# Patient Record
Sex: Male | Born: 1985 | Race: Black or African American | Hispanic: No | Marital: Single | Smoking: Current every day smoker
Health system: Southern US, Community
[De-identification: ages and names within clinical notes are randomized; demographics above are authoritative.]

---
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
## 2007-07-20 ENCOUNTER — Emergency Department (HOSPITAL_COMMUNITY): Admission: EM | Admit: 2007-07-20 | Discharge: 2007-07-20 | Payer: Self-pay | Admitting: Emergency Medicine

## 2014-08-23 ENCOUNTER — Emergency Department (INDEPENDENT_AMBULATORY_CARE_PROVIDER_SITE_OTHER)
Admission: EM | Admit: 2014-08-23 | Discharge: 2014-08-23 | Disposition: A | Discharge status: Home / Self Care | Payer: Self-pay | Source: Home / Self Care | Attending: Emergency Medicine | Admitting: Emergency Medicine

## 2014-08-23 ENCOUNTER — Encounter (HOSPITAL_COMMUNITY): Payer: Self-pay | Admitting: Emergency Medicine

## 2014-08-23 DIAGNOSIS — S46019A Strain of muscle(s) and tendon(s) of the rotator cuff of unspecified shoulder, initial encounter: Secondary | ICD-10-CM

## 2014-08-23 DIAGNOSIS — S060X0A Concussion without loss of consciousness, initial encounter: Secondary | ICD-10-CM

## 2014-08-23 DIAGNOSIS — S161XXA Strain of muscle, fascia and tendon at neck level, initial encounter: Secondary | ICD-10-CM

## 2014-08-23 DIAGNOSIS — S39012A Strain of muscle, fascia and tendon of lower back, initial encounter: Secondary | ICD-10-CM

## 2014-08-23 DIAGNOSIS — Y92410 Unspecified street and highway as the place of occurrence of the external cause: Secondary | ICD-10-CM

## 2014-08-23 MED ORDER — DICLOFENAC SODIUM 75 MG PO TBEC: 75.0000 mg | DELAYED_RELEASE_TABLET | Freq: Two times a day (BID) | ORAL | Status: DC

## 2014-08-23 MED ORDER — CYCLOBENZAPRINE HCL 5 MG PO TABS: 5.0000 mg | ORAL_TABLET | Freq: Three times a day (TID) | ORAL | Status: AC | PRN

## 2014-08-23 MED ORDER — HYDROCODONE-ACETAMINOPHEN 5-325 MG PO TABS: ORAL_TABLET | ORAL | Status: AC
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
## 2014-08-23 NOTE — Discharge Instructions (Signed)
TREATMENT  °Treatment initially involves the use of ice and medication to help reduce pain and inflammation. It is also important to perform strengthening and stretching exercises and modify activities that worsen symptoms so the injury does not get worse. These exercises may be performed at home or with a therapist. For patients who experience severe symptoms, a soft padded collar may be recommended to be worn around the neck.  °Improving your posture may help reduce symptoms. Posture improvement includes pulling your chin and abdomen in while sitting or standing. If you are sitting, sit in a firm chair with your buttocks against the back of the chair. While sleeping, try replacing your pillow with a small towel rolled to 2 inches in diameter, or use a cervical pillow. Poor sleeping positions delay healing.  ° °MEDICATION  °· If pain medication is necessary, nonsteroidal anti-inflammatory medications, such as aspirin and ibuprofen, or other minor pain relievers, such as acetaminophen, are often recommended. °· Do not take pain medication for 7 days before surgery. °· Prescription pain relievers may be given if deemed necessary by your caregiver. Use only as directed and only as much as you need. ° °HEAT AND COLD:  °· Cold treatment (icing) relieves pain and reduces inflammation. Cold treatment should be applied for 10 to 15 minutes every 2 to 3 hours for inflammation and pain and immediately after any activity that aggravates your symptoms. Use ice packs or an ice massage. °· Heat treatment may be used prior to performing the stretching and strengthening activities prescribed by your caregiver, physical therapist, or athletic trainer. Use a heat pack or a warm soak. ° °SEEK MEDICAL CARE IF:  °· Symptoms get worse or do not improve in 2 weeks despite treatment. °· New, unexplained symptoms develop (drugs used in treatment may produce side effects). ° °EXERCISES °RANGE OF MOTION (ROM) AND STRETCHING EXERCISES -  Cervical Strain and Sprain °These exercises may help you when beginning to rehabilitate your injury. In order to successfully resolve your symptoms, you must improve your posture. These exercises are designed to help reduce the forward-head and rounded-shoulder posture which contributes to this condition. Your symptoms may resolve with or without further involvement from your physician, physical therapist or athletic trainer. While completing these exercises, remember:  °· Restoring tissue flexibility helps normal motion to return to the joints. This allows healthier, less painful movement and activity. °· An effective stretch should be held for at least 20 seconds, although you may need to begin with shorter hold times for comfort. °· A stretch should never be painful. You should only feel a gentle lengthening or release in the stretched tissue. ° °STRETCH- Axial Extensors °· Lie on your back on the floor. You may bend your knees for comfort. Place a rolled up hand towel or dish towel, about 2 inches in diameter, under the part of your head that makes contact with the floor. °· Gently tuck your chin, as if trying to make a "double chin," until you feel a gentle stretch at the base of your head. °· Hold _____10_____ seconds. °Repeat _____10_____ times. Complete this exercise _____2_____ times per day.  ° °STRETECH - Axial Extension  °· Stand or sit on a firm surface. Assume a good posture: chest up, shoulders drawn back, abdominal muscles slightly tense, knees unlocked (if standing) and feet hip width apart. °· Slowly retract your chin so your head slides back and your chin slightly lowers.Continue to look straight ahead. °· You should feel a gentle stretch   in the back of your head. Be certain not to feel an aggressive stretch since this can cause headaches later. °· Hold for ____10______ seconds. °Repeat _____10_____ times. Complete this exercise ____2______ times per day. ° °STRETCH  Cervical Side Bend  °· Stand  or sit on a firm surface. Assume a good posture: chest up, shoulders drawn back, abdominal muscles slightly tense, knees unlocked (if standing) and feet hip width apart. °· Without letting your nose or shoulders move, slowly tip your right / left ear to your shoulder until your feel a gentle stretch in the muscles on the opposite side of your neck. °· Hold _____10_____ seconds. °Repeat _____10_____ times. Complete this exercise _____2_____ times per day. ° °STRETCH  Cervical Rotators  °· Stand or sit on a firm surface. Assume a good posture: chest up, shoulders drawn back, abdominal muscles slightly tense, knees unlocked (if standing) and feet hip width apart. °· Keeping your eyes level with the ground, slowly turn your head until you feel a gentle stretch along the back and opposite side of your neck. °· Hold _____10_____ seconds. °Repeat ____10______ times. Complete this exercise ____2______ times per day. ° °RANGE OF MOTION - Neck Circles  °· Stand or sit on a firm surface. Assume a good posture: chest up, shoulders drawn back, abdominal muscles slightly tense, knees unlocked (if standing) and feet hip width apart. °· Gently roll your head down and around from the back of one shoulder to the back of the other. The motion should never be forced or painful. °· Repeat the motion 10-20 times, or until you feel the neck muscles relax and loosen. °Repeat ____10______ times. Complete the exercise _____2_____ times per day. ° °STRENGTHENING EXERCISES - Cervical Strain and Sprain °These exercises may help you when beginning to rehabilitate your injury. They may resolve your symptoms with or without further involvement from your physician, physical therapist or athletic trainer. While completing these exercises, remember:  °· Muscles can gain both the endurance and the strength needed for everyday activities through controlled exercises. °· Complete these exercises as instructed by your physician, physical therapist or  athletic trainer. Progress the resistance and repetitions only as guided. °· You may experience muscle soreness or fatigue, but the pain or discomfort you are trying to eliminate should never worsen during these exercises. If this pain does worsen, stop and make certain you are following the directions exactly. If the pain is still present after adjustments, discontinue the exercise until you can discuss the trouble with your clinician. ° °STRENGTH Cervical Flexors, Isometric °· Face a wall, standing about 6 inches away. Place a small pillow, a ball about 6-8 inches in diameter, or a folded towel between your forehead and the wall. °· Slightly tuck your chin and gently push your forehead into the soft object. Push only with mild to moderate intensity, building up tension gradually. Keep your jaw and forehead relaxed. °· Hold 10 to 20 seconds. Keep your breathing relaxed. °· Release the tension slowly. Relax your neck muscles completely before you start the next repetition. °Repeat _____10_____ times. Complete this exercise _____2_____ times per day. ° °STRENGTH- Cervical Lateral Flexors, Isometric  °· Stand about 6 inches away from a wall. Place a small pillow, a ball about 6-8 inches in diameter, or a folded towel between the side of your head and the wall. °· Slightly tuck your chin and gently tilt your head into the soft object. Push only with mild to moderate intensity, building up tension gradually. Keep   your jaw and forehead relaxed. °· Hold 10 to 20 seconds. Keep your breathing relaxed. °· Release the tension slowly. Relax your neck muscles completely before you start the next repetition. °Repeat _____10_____ times. Complete this exercise ____2______ times per day. ° °STRENGTH  Cervical Extensors, Isometric  °· Stand about 6 inches away from a wall. Place a small pillow, a ball about 6-8 inches in diameter, or a folded towel between the back of your head and the wall. °· Slightly tuck your chin and gently  tilt your head back into the soft object. Push only with mild to moderate intensity, building up tension gradually. Keep your jaw and forehead relaxed. °· Hold 10 to 20 seconds. Keep your breathing relaxed. °· Release the tension slowly. Relax your neck muscles completely before you start the next repetition. °Repeat _____10_____ times. Complete this exercise _____2_____ times per day. ° °POSTURE AND BODY MECHANICS CONSIDERATIONS - Cervical Strain and Sprain °Keeping correct posture when sitting, standing or completing your activities will reduce the stress put on different body tissues, allowing injured tissues a chance to heal and limiting painful experiences. The following are general guidelines for improved posture. Your physician or physical therapist will provide you with any instructions specific to your needs. While reading these guidelines, remember: °· The exercises prescribed by your provider will help you have the flexibility and strength to maintain correct postures. °· The correct posture provides the optimal environment for your joints to work. All of your joints have less wear and tear when properly supported by a spine with good posture. This means you will experience a healthier, less painful body. °· Correct posture must be practiced with all of your activities, especially prolonged sitting and standing. Correct posture is as important when doing repetitive low-stress activities (typing) as it is when doing a single heavy-load activity (lifting). °PROLONGED STANDING WHILE SLIGHTLY LEANING FORWARD °When completing a task that requires you to lean forward while standing in one place for a long time, place either foot up on a stationary 2-4 inch high object to help maintain the best posture. When both feet are on the ground, the low back tends to lose its slight inward curve. If this curve flattens (or becomes too large), then the back and your other joints will experience too much stress, fatigue  more quickly and can cause pain.  °RESTING POSITIONS °Consider which positions are most painful for you when choosing a resting position. If you have pain with flexion-based activities (sitting, bending, stooping, squatting), choose a position that allows you to rest in a less flexed posture. You would want to avoid curling into a fetal position on your side. If your pain worsens with extension-based activities (prolonged standing, working overhead), avoid resting in an extended position such as sleeping on your stomach. Most people will find more comfort when they rest with their spine in a more neutral position, neither too rounded nor too arched. Lying on a non-sagging bed on your side with a pillow between your knees, or on your back with a pillow under your knees will often provide some relief. Keep in mind, being in any one position for a prolonged period of time, no matter how correct your posture, can still lead to stiffness. °WALKING °Walk with an upright posture. Your ears, shoulders and hips should all line-up. °OFFICE WORK °When working at a desk, create an environment that supports good, upright posture. Without extra support, muscles fatigue and lead to excessive strain on joints and other tissues. °  CHAIR:  A chair should be able to slide under your desk when your back makes contact with the back of the chair. This allows you to work closely.  The chair's height should allow your eyes to be level with the upper part of your monitor and your hands to be slightly lower than your elbows.  Body position:  Your feet should make contact with the floor. If this is not possible, use a foot rest.  Keep your ears over your shoulders. This will reduce stress on your neck and low back. Document Released: 11/07/2005 Document Revised: 01/30/2012 Document Reviewed: 02/19/2009 [facility] Patient Information 2013 [place].  Most shoulder pain is caused by soft tissue problems rather than arthritis.   Rotator cuff tendonitis or tendonosis, rotator cuff tears, impingement syndrome and cartilege (labrum tears) are a few of the common causes of shoulder pain.  Fortunately, most of these can be treated with conservative measures as outlined below.  Do not do the following:  Doing any work with the arms above shoulder level (especially lifting) until the pain has subsided.  Sleeping on the affected side.  Especially avoid sleeping with your arm under your head or your pillow.  This is a habit that is hard to break.  Some people have to pin the arm of their pajamas to the chest area to prevent this.  Do the following:  Do the shoulder exercises below twice daily followed by ice for 10 minutes.  If no better in 1 month, follow up here, with your primary care doctor, or with an orthopedist.  Use of over the counter pain meds can be of help.  Tylenol (or acetaminophen) is the safest to use.  It often helps to take this regularly.  You can take up to 2 325 mg tablets 5 times daily, but it best to start out much lower that that, perhaps 2 325 mg tablets twice daily, then increase from there. People who are on the blood thinner warfarin have to be careful about taking high doses of Tylenol.  For people who are able to tolerate them, ibuprofen and Aleve can also help with the pain.  You should discuss these agents with your physician before taking them.  People with chronic kidney disease, hypertension, peptic ulcer disease, and reflux can suffer adverse side effects. They should not be taken with warfarin. The maximum dosage of ibuprofen is 800 mg 3 times daily with meals.  The maximum dosage of Aleve is 2 and 1/2 tablets twice daily with food, but again, start out low and gradually increase the dose until adequate pain relief is achieved. Ibuprofen and Aleve should always be taken with food.       Do exercises twice daily followed by moist heat for 15 minutes.      Try to be as active as  possible.  If no better in 2 weeks, follow up with orthopedist.

## 2015-09-01 ENCOUNTER — Emergency Department (HOSPITAL_COMMUNITY)
Admission: EM | Admit: 2015-09-01 | Discharge: 2015-09-01 | Disposition: A | Discharge status: Home / Self Care | Payer: No Typology Code available for payment source | Attending: Emergency Medicine | Admitting: Emergency Medicine

## 2015-09-01 ENCOUNTER — Emergency Department (HOSPITAL_COMMUNITY): Payer: No Typology Code available for payment source

## 2015-09-01 ENCOUNTER — Encounter (HOSPITAL_COMMUNITY): Payer: Self-pay | Admitting: Emergency Medicine

## 2015-09-01 DIAGNOSIS — Y998 Other external cause status: Secondary | ICD-10-CM | POA: Diagnosis not present

## 2015-09-01 DIAGNOSIS — Y9389 Activity, other specified: Secondary | ICD-10-CM | POA: Insufficient documentation

## 2015-09-01 DIAGNOSIS — S3992XA Unspecified injury of lower back, initial encounter: Secondary | ICD-10-CM | POA: Diagnosis not present

## 2015-09-01 DIAGNOSIS — Z720 Tobacco use: Secondary | ICD-10-CM | POA: Diagnosis not present

## 2015-09-01 DIAGNOSIS — S4992XA Unspecified injury of left shoulder and upper arm, initial encounter: Secondary | ICD-10-CM | POA: Diagnosis not present

## 2015-09-01 DIAGNOSIS — S4991XA Unspecified injury of right shoulder and upper arm, initial encounter: Secondary | ICD-10-CM | POA: Insufficient documentation

## 2015-09-01 DIAGNOSIS — S8992XA Unspecified injury of left lower leg, initial encounter: Secondary | ICD-10-CM | POA: Diagnosis not present

## 2015-09-01 DIAGNOSIS — Z79899 Other long term (current) drug therapy: Secondary | ICD-10-CM | POA: Insufficient documentation

## 2015-09-01 DIAGNOSIS — Y92410 Unspecified street and highway as the place of occurrence of the external cause: Secondary | ICD-10-CM | POA: Insufficient documentation

## 2015-09-01 MED ORDER — IBUPROFEN 800 MG PO TABS
800.0000 mg | ORAL_TABLET | Freq: Once | ORAL | Status: AC
  Administered 2015-09-01: 800 mg via ORAL
  Filled 2015-09-01: qty 1

## 2015-09-01 NOTE — Discharge Instructions (Signed)
Continue taking ibuprofen as prescribed over-the-counter and using ice for pain relief at home. Follow-up with her primary care provider listed in the resource guide provided below within the next week. Return to the emergency department if symptoms worsen or new onset of swelling, numbness, tingling, weakness.   Emergency Department Resource Guide 1) Find a Doctor and Pay Out of Pocket Although you won't have to find out who is covered by your insurance plan, it is a good idea to ask around and get recommendations. You will then need to call the office and see if the doctor you have chosen will accept you as a new patient and what types of options they offer for patients who are self-pay. Some doctors offer discounts or will set up payment plans for their patients who do not have insurance, but you will need to ask so you aren't surprised when you get to your appointment.  2) Contact Your Local Health Department Not all health departments have doctors that can see patients for sick visits, but many do, so it is worth a call to see if yours does. If you don't know where your local health department is, you can check in your phone book. The CDC also has a tool to help you locate your state's health department, and many state websites also have listings of all of their local health departments.  3) Find a Walk-in Clinic If your illness is not likely to be very severe or complicated, you may want to try a walk in clinic. These are popping up all over the country in pharmacies, drugstores, and shopping centers. They're usually staffed by nurse practitioners or physician assistants that have been trained to treat common illnesses and complaints. They're usually fairly quick and inexpensive. However, if you have serious medical issues or chronic medical problems, these are probably not your best option.  No Primary Care Doctor: - Call Health Connect at  [phone] - they can help you locate a primary care  doctor that  accepts your insurance, provides certain services, etc. - Physician Referral Service- [phone]  Chronic Pain Problems: Organization         Address  Phone   Notes  [facility]  [phone] Patients need to be referred by their primary care doctor.   Medication Assistance: Organization         Address  Phone   Notes  [facility] Medication [facility] [address] [place], [place] [address] [phone] --Must be a resident of [facility] -- Must have NO insurance coverage whatsoever (no Medicaid/ Medicare, etc.) -- The pt. MUST have a primary care doctor that directs their care regularly and follows them in the community   [facility]ist  [phone]   [employer]  [phone]    Agencies that provide inexpensive medical care: Organization         Address  Phone   Notes  [facility]  [phone]   [facility]    [phone]   [facility] [address] [place], [place] [address] [phone]   [facility] [address] [place], [place] [phone]   Planned Parenthood    [phone]   [facility]    [phone]   [facility] and [facility]  [address], [place] Phone:  [phone], Fax:  [phone] Hours of Operation:  9 am - 6  pm, M-F.  Also accepts Medicaid/Medicare and self-pay.   [facility]  [address], [place] Phone: [phone], Fax: [phone]. Hours of Operation:  8:30 am - 5:30 pm, M-F.  Also accepts Medicaid and self-pay.  [facility] [place] [address], [place] Phone: [phone](336) [phone]   [facility] [address], [place], [place] [phone], Ext. [phone] Mondays & Thursdays: 7-9 AM.  First 15 patients are seen on a first come, first serve basis.    Medicaid-accepting [facility]  Providers:  Organization         Address  Phone   Notes  [facility] [address], [place] [phone] Also accepts self-pay patients.  [facility] [address], [place]  [phone]) [phone]   [facility] [address], [place] [phone]) [phone]   [facility] [address] [place] [address], [place] [phone]) [phone]   Renaye RakersVeita Bland [address] [place] [facility], [place]   [phone] Only accepts [place] Access [place] patients after they have their name applied to their card.   Self-Pay (no insurance) in [facility]:  Organization         Address  Phone   Notes  Sickle Cell Patients, [facility] [address], [place] [phone]   [facility] [address], [place] [phone]   [facility]  [address],  [phone]   [facility]/Dr. Osei-Bonsu  [address] [place] [address], [place] or [phone], [place] [phone] Phone number for both [place] and [place] locations is the same.  [facility] [address], [place] [phone]   [facility] [address] [place] [address], [place] or [address] [phone]   [facility] [address], [place] [phone]) [phone], phone; [phone], fax Sees patients 1st and 3rd Saturday of every month.  Must not qualify for public or private insurance (i.e. Medicaid, Medicare, [place] Health Choice, Veterans' Benefits)  Household income should be no more than 200% of the poverty level The clinic cannot treat you if you are pregnant or think you are pregnant  Sexually transmitted diseases are not treated at the clinic.    Dental Care: Organization         Address  Phone  Notes  [facility] of [facility] [address], [place] [phone]) [phone] Accepts children up to age 30 who are enrolled in [place] or [place] Health Choice; pregnant women with a Medicaid card; and children who have applied for Medicaid or [place] Health Choice, but were declined, whose parents can pay a reduced fee at time of service.  [facility] of [facility] [place]  [address], [place] [phone] Accepts children up to age 30 who are enrolled in [place] or [place] Health Choice; pregnant women with a Medicaid card; and children who have applied for Medicaid or [place] Health Choice, but were declined, whose parents can pay a reduced fee at time of service.  [facility]  [address], [place] [phone] Patients are seen by appointment only. Walk-ins are not accepted. [facility] will see patients 30 years of age and older.  Monday - Tuesday (8am-5pm) Most Wednesdays (8:30-5pm) $30 per visit, cash only  [facility]  [address], [facility] [phone] Patients are seen by appointment only. Walk-ins are not accepted. [facility] will see patients 33 years of age and older. One Wednesday Evening (Monthly: Volunteer Based).  $30 per visit, cash only  [employer]  [phone] for adults; Children under age 2, call Graduate Pediatric Dentistry at [phone]. Children aged 52-14, please call [phone] to request a pediatric application.  Dental services are provided in all areas of dental care including fillings, crowns and bridges, complete and partial dentures, implants, gum treatment, root canals, and extractions. Preventive care is also provided. Treatment is provided to both adults and children. Patients are selected via a lottery and there is often a waiting list.   [facility] [address] [place]  [phone] [email or web address]   [facility]  [address] [place], [place] [phone], Ext. [phone] Second and Fourth Thursday of each month, opens at 6:30 AM; Clinic ends at 9 AM.  Patients are seen on a first-come first-served basis, and a limited number are seen during each clinic.   [facility]  [address] [place], [place] [phone]   Eligibility Requirements You must have lived in [place], [place], or [place] counties for at least the last three months.   You cannot be eligible for state or federal sponsored [place], including [employer], [place], or [employer].   You generally cannot be eligible for healthcare insurance through your employer.    How to apply: Eligibility screenings are held every Tuesday and Wednesday afternoon from 1:00 pm until 4:00 pm. You do not need an appointment for the interview!  [facility] [address], [place], [place] [phone]   [facility] Health Department  [phone]   [facility]  [phone]   [facility] Health Department  [phone]    Behavioral Health Resources in the Community: Intensive Outpatient Programs Organization         Address  Phone  Notes  [facility] [address], [place], [place] [phone]   [facility] Outpatient [address], [place], [place] [phone]   [facility] [address], [place], [place]  [phone]   [facility] Mental Health [address]  [place], [place] [phone] or [phone]   Substance Abuse Resources Organization         Address  Phone  Notes  [facility] Drug Services  [phone]   [facility]  [phone]   [facility] [place]  [phone]   Floydene Flock  [phone]   Residential & Outpatient Substance Abuse Program  [phone]   Psychological Services Organization         Address  Phone  Notes  [facility] Behavioral Health  [phone]    [facility]  [phone]   [facility] Mental Health [address] [place] [phone] or [phone]    Mobile Crisis Teams Organization         Address  Phone  Notes  [facility], Mobile Crisis Care Unit  [phone]   [facility]  [address] [place], [place] [phone]   Doristine Locks [address] [place] [phone]    Self-Help/Support Groups Organization         Address  Phone  Notes  [facility] [place] - variety of support groups  [phone]- I7437963 Call for more information  Narcotics Anonymous (NA), [facility] [address], [employer] [place]  2 meetings at this location   Statistician         Address  Phone  Notes  [facility] [address],    [place]  [phone]   [facility]  [address], [place] [address], [place], [place] [phone]   [facility] [address] [place], [place] [phone] Admissions: 8am-3pm M-F  [facility] [address],    [place], [place] [phone]   [facility] [address] [place], [place], [place] [phone]   [facility] [address]  [place], [place] [phone]   [facility] - Intensive Outpatient [address], [place], [place] [phone]   [facility] [address]  [place], [place] [phone] or [phone]   [facility] ([facility]) [address] [place], [place] [phone] Accepts Medicaid  [facility] [address],  [place] [phone] Substance Abuse/Addiction Treatment   [facility] Organization         Address  Phone  Notes  [facility]  [phone]   Angie Fava, PhD [address] Kn[address] [place], [place]   [phone] or [phone]    [facility] Behavioral   [address] [place], [place] [phone]   [facility] [address], [place], [place] [phone] Insurance/Medicaid/sponsorship through [facility] [address]                                    [place], [place] [phone] Therapy/tele-psych/case  [facility] [address], [place] [phone]    Dr. Lolly Mustache  [phone]   [facility]  [employer] [facility] Dept. 1) [address], [place] 2) [address] [place] 3)  [address], [place] [phone]  [phone]   [facility] Child Abuse Hotline [phone] or [phone] (After Hours)
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
## 2017-11-15 ENCOUNTER — Other Ambulatory Visit: Payer: Self-pay

## 2017-11-15 ENCOUNTER — Encounter (HOSPITAL_COMMUNITY): Payer: Self-pay | Admitting: *Deleted

## 2017-11-15 ENCOUNTER — Emergency Department (HOSPITAL_COMMUNITY)
Admission: EM | Admit: 2017-11-15 | Discharge: 2017-11-15 | Disposition: A | Discharge status: Home / Self Care | Payer: BLUE CROSS/BLUE SHIELD | Attending: Emergency Medicine | Admitting: Emergency Medicine

## 2017-11-15 DIAGNOSIS — F17210 Nicotine dependence, cigarettes, uncomplicated: Secondary | ICD-10-CM | POA: Diagnosis not present

## 2017-11-15 DIAGNOSIS — R1084 Generalized abdominal pain: Secondary | ICD-10-CM

## 2017-11-15 DIAGNOSIS — R112 Nausea with vomiting, unspecified: Secondary | ICD-10-CM | POA: Diagnosis not present

## 2017-11-15 DIAGNOSIS — Z79899 Other long term (current) drug therapy: Secondary | ICD-10-CM | POA: Insufficient documentation

## 2017-11-15 DIAGNOSIS — R197 Diarrhea, unspecified: Secondary | ICD-10-CM | POA: Insufficient documentation

## 2017-11-15 LAB — COMPREHENSIVE METABOLIC PANEL
ALT: 34 U/L (ref 17–63)
AST: 41 U/L (ref 15–41)
Albumin: 5.3 g/dL — ABNORMAL HIGH (ref 3.5–5.0)
Alkaline Phosphatase: 61 U/L (ref 38–126)
Anion gap: 12 (ref 5–15)
BUN: 15 mg/dL (ref 6–20)
CHLORIDE: 104 mmol/L (ref 101–111)
CO2: 24 mmol/L (ref 22–32)
Calcium: 10.2 mg/dL (ref 8.9–10.3)
Creatinine, Ser: 1.18 mg/dL (ref 0.61–1.24)
GFR calc non Af Amer: >60 mL/min (ref >60)
Glucose, Bld: 187 mg/dL — ABNORMAL HIGH (ref 65–99)
POTASSIUM: 3.8 mmol/L (ref 3.5–5.1)
SODIUM: 140 mmol/L (ref 135–145)
Total Bilirubin: 3.1 mg/dL — ABNORMAL HIGH (ref 0.3–1.2)
Total Protein: 8.4 g/dL — ABNORMAL HIGH (ref 6.5–8.1)

## 2017-11-15 LAB — URINALYSIS, ROUTINE W REFLEX MICROSCOPIC
Bilirubin Urine: NEGATIVE
GLUCOSE, UA: NEGATIVE mg/dL
HGB URINE DIPSTICK: NEGATIVE
Ketones, ur: 5 mg/dL — AB
LEUKOCYTES UA: NEGATIVE
Nitrite: NEGATIVE
PH: 5 (ref 5.0–8.0)
Protein, ur: NEGATIVE mg/dL
SPECIFIC GRAVITY, URINE: 1.026 (ref 1.005–1.030)

## 2017-11-15 LAB — CBC
HEMATOCRIT: 51.7 % (ref 39.0–52.0)
Hemoglobin: 18.6 g/dL — ABNORMAL HIGH (ref 13.0–17.0)
MCH: 31.5 pg (ref 26.0–34.0)
MCHC: 36 g/dL (ref 30.0–36.0)
MCV: 87.6 fL (ref 78.0–100.0)
Platelets: 187 10*3/uL (ref 150–400)
RBC: 5.9 MIL/uL — AB (ref 4.22–5.81)
RDW: 12.6 % (ref 11.5–15.5)
WBC: 14.8 10*3/uL — AB (ref 4.0–10.5)

## 2017-11-15 LAB — LIPASE, BLOOD: LIPASE: 29 U/L (ref 11–51)

## 2017-11-15 MED ORDER — MORPHINE SULFATE (PF) 4 MG/ML IV SOLN
2.0000 mg | Freq: Once | INTRAVENOUS | Status: AC
  Administered 2017-11-15: 2 mg via INTRAVENOUS
  Filled 2017-11-15: qty 1

## 2017-11-15 MED ORDER — SODIUM CHLORIDE 0.9 % IV BOLUS (SEPSIS)
2000.0000 mL | Freq: Once | INTRAVENOUS | Status: AC
  Administered 2017-11-15: 2000 mL via INTRAVENOUS

## 2017-11-15 MED ORDER — ONDANSETRON 4 MG PO TBDP: 4.0000 mg | ORAL_TABLET | Freq: Three times a day (TID) | ORAL | 0 refills | Status: AC | PRN

## 2017-11-15 MED ORDER — ONDANSETRON HCL 4 MG/2ML IJ SOLN
4.0000 mg | Freq: Once | INTRAMUSCULAR | Status: AC
  Administered 2017-11-15: 4 mg via INTRAVENOUS
  Filled 2017-11-15: qty 2

## 2017-11-15 NOTE — ED Notes (Signed)
IV attempt X2 

## 2017-11-15 NOTE — ED Triage Notes (Signed)
Pt c/o NVD x 5 hours with abd cramping

## 2017-11-15 NOTE — ED Provider Notes (Signed)
Pt handed off to me by previous EDPA Muthersbaugh at shift change pending reassessment. Please see previous EDPA note for full HPI, ROS and PE. Briefly, pt is a 31 yo male who presents with periumbilical/diffuse crampy abdominal pain a/w nausea, vomiting, non bloody diarrhea onset suddenly at midnight. No fevers, chills, melena, hematochezia, IBD/IBS, abdominal surgeries. He has received morphine, zofran and IVF.   On my exam, pt reports feeling "great". Denies nausea, abdominal pain. Last emesis two hours ago. No BM in ED. He is asking for ginger ale. No signs of dehydration. Abdomen is soft NTND. VS WNL.   Pt tolerated PO challenge and did not require repeat meds. Pt considered safe for d/c at this time with zofran and gentle hydration. Discussed return precautions.    Liberty HandyGibbons, Claudia J, PA-C 11/15/17 [id number]    Zadie RhineWickline, Donald, MD 11/15/17 [phone]

## 2017-11-15 NOTE — ED Provider Notes (Signed)
[facility] EMERGENCY DEPARTMENT Provider Note   CSN: [id number] Arrival date & time: 11/15/17  0340     History   Chief Complaint Chief Complaint  Patient presents with  . Abdominal Pain    HPI TIMOTHEY DAHLSTROM is a 31 y.o. male with a hx of no major medical problems presents to the Emergency Department complaining of gradual, persistent, progressively worsening generalized abdominal pain onset around midnight last night.  Patient reports he awoke with some cramping in his stomach and began to vomit profusely.  Emesis is nonbloody nonbilious.  He reports numerous counts of emesis and diarrhea.  He reports no melena or hematochezia with his diarrhea.  No previous abdominal surgeries.  No recent international travel or camping.  Patient reports his children have been sick with influenza-like illness.  Patient reports his abdominal pain has come in waves with the persistent vomiting and diarrhea.  He denies fevers or chills, neck pain, chest pain, shortness of breath, weakness, dizziness, syncope, dysuria, hematuria.  Treatments prior to arrival.  No known aggravating or alleviating factors  The history is provided by the patient and medical records. No language interpreter was used.    History reviewed. No pertinent past medical history.  There are no active problems to display for this patient.   History reviewed. No pertinent surgical history.     Home Medications    Prior to Admission medications   Medication Sig Start Date End Date Taking? Authorizing Provider  cyclobenzaprine (FLEXERIL) 5 MG tablet Take 1 tablet (5 mg total) by mouth 3 (three) times daily as needed for muscle spasms. 08/23/14  Yes Reuben Likes, MD  HYDROcodone-acetaminophen (NORCO/VICODIN) 5-325 MG per tablet 1 to 2 tabs every 4 to 6 hours as needed for pain. 08/23/14  Yes Reuben Likes, MD    Family History No family history on file.  Social History Social History   Tobacco  Use  . Smoking status: Current Every Day Smoker  . Smokeless tobacco: Never Used  Substance Use Topics  . Alcohol use: No  . Drug use: No     Allergies   Patient has no known allergies.   Review of Systems Review of Systems  Constitutional: Negative for appetite change, diaphoresis, fatigue, fever and unexpected weight change.  HENT: Negative for mouth sores.   Eyes: Negative for visual disturbance.  Respiratory: Negative for cough, chest tightness, shortness of breath and wheezing.   Cardiovascular: Negative for chest pain.  Gastrointestinal: Positive for abdominal pain, diarrhea, nausea and vomiting. Negative for constipation.  Endocrine: Negative for polydipsia, polyphagia and polyuria.  Genitourinary: Negative for dysuria, frequency, hematuria and urgency.  Musculoskeletal: Negative for back pain and neck stiffness.  Skin: Negative for rash.  Allergic/Immunologic: Negative for immunocompromised state.  Neurological: Negative for syncope, light-headedness and headaches.  Hematological: Does not bruise/bleed easily.  Psychiatric/Behavioral: Negative for sleep disturbance. The patient is not nervous/anxious.      Physical Exam Updated Vital Signs BP 104/73   Pulse 90   Temp 97.6 F (36.4 C) (Oral)   Resp 18   SpO2 97%   Physical Exam  Constitutional: He appears well-developed and well-nourished. No distress.  Awake, alert, nontoxic appearance  HENT:  Head: Normocephalic and atraumatic.  Mouth/Throat: Oropharynx is clear and moist. No oropharyngeal exudate.  Eyes: Conjunctivae are normal. No scleral icterus.  Neck: Normal range of motion. Neck supple.  Cardiovascular: Normal rate, regular rhythm and intact distal pulses.  Pulmonary/Chest: Effort normal and breath sounds  normal. No respiratory distress. He has no wheezes.  Equal chest expansion  Abdominal: Soft. Bowel sounds are normal. He exhibits no mass. There is no hepatosplenomegaly. There is generalized  tenderness. There is no rigidity, no rebound, no guarding and no CVA tenderness. Hernia confirmed negative in the right inguinal area and confirmed negative in the left inguinal area.  Genitourinary: Testes normal and penis normal. Cremasteric reflex is present. Right testis shows no mass, no swelling and no tenderness. Right testis is descended. Left testis shows no mass, no swelling and no tenderness. Left testis is descended. No phimosis, paraphimosis, hypospadias, penile erythema or penile tenderness. No discharge found.  Musculoskeletal: Normal range of motion. He exhibits no edema.  Lymphadenopathy: No inguinal adenopathy noted on the right or left side.  Neurological: He is alert.  Speech is clear and goal oriented Moves extremities without ataxia  Skin: Skin is warm and dry. He is not diaphoretic.  Psychiatric: He has a normal mood and affect.  Nursing note and vitals reviewed.    ED Treatments / Results  Labs (all labs ordered are listed, but only abnormal results are displayed) Labs Reviewed  COMPREHENSIVE METABOLIC PANEL - Abnormal; Notable for the following components:      Result Value   Glucose, Bld 187 (*)    Total Protein 8.4 (*)    Albumin 5.3 (*)    Total Bilirubin 3.1 (*)    All other components within normal limits  CBC - Abnormal; Notable for the following components:   WBC 14.8 (*)    RBC 5.90 (*)    Hemoglobin 18.6 (*)    All other components within normal limits  LIPASE, BLOOD  URINALYSIS, ROUTINE W REFLEX MICROSCOPIC     Procedures Procedures (including critical care time)  Medications Ordered in ED Medications  ondansetron (ZOFRAN) injection 4 mg (4 mg Intravenous Given 11/15/17 0558)  morphine 4 MG/ML injection 2 mg (2 mg Intravenous Given 11/15/17 0558)  sodium chloride 0.9 % bolus 2,000 mL (2,000 mLs Intravenous New Bag/Given 11/15/17 0644)     Initial Impression / Assessment and Plan / ED Course  I have reviewed the triage vital signs and  the nursing notes.  Pertinent labs & imaging results that were available during my care of the patient were reviewed by me and considered in my medical decision making (see chart for details).  Clinical Course as of Nov 15 714  Wed Nov 15, 2017  0714 On repeat exam, patient's abdomen is soft nontender without rebound or guarding.  He reports his pain is almost completely resolved.  [HM]  [id number] Pending urinalysis.  [HM]    Clinical Course User Index [HM] Taiwana Willison, Dahlia ClientHannah, PA-C    Patient is nontoxic, nonseptic appearing, in no apparent distress.  Patient's pain and other symptoms adequately managed in emergency department.  Fluid bolus given.  Labs, imaging and vitals reviewed.  Pt does have elevated total Bili however he does NOT have RUQ abd pain or jaundice.  Pt appears to have some hemoconcentration and does have somewhat dry mucous membrancePatient does not meet the SIRS or Sepsis criteria.  On repeat exam patient does not have a surgical abdomin and there are no peritoneal signs.  No indication of appendicitis, bowel obstruction, bowel perforation, cholecystitis, diverticulitis, testicular torsion.  Pt has received medications and a fluid bolus.     7:15 AM At shift change care was transferred to Sharen Hecklaudia Gibbons, PA-C who will follow pending studies, re-evaulate and determine disposition.  Final Clinical Impressions(s) / ED Diagnoses   Final diagnoses:  Nausea vomiting and diarrhea  Generalized abdominal pain    ED Discharge Orders    None       Mardene SayerMuthersbaugh, Boyd KerbsHannah, PA-C 11/15/17 0715    Zadie RhineWickline, Donald, MD 11/15/17 [phone]

## 2017-11-15 NOTE — Discharge Instructions (Signed)
Your lab work was ok today.  Your symptoms improved after pain and nausea medicine. I suspect a viral stomach bug.   Stay well hydrated. Stick to mild, gentle diet for the next 12-24 hours and return back to normal diet slowly as tolerated. Use zofran as needed for nausea.   Return to ED for worsening abdominal pain, vomiting, fevers, chills, blood in stool or vomit

## 2017-11-15 NOTE — ED Notes (Signed)
Pt drank 2 cups water with no effects.
